# Patient Record
Sex: Male | Born: 1960 | Race: Black or African American | Hispanic: No | Marital: Married | Smoking: Never smoker
Health system: Southern US, Community
[De-identification: ages and names within clinical notes are randomized; demographics above are authoritative.]

---

## 2008-09-04 ENCOUNTER — Emergency Department (HOSPITAL_COMMUNITY): Admission: EM | Admit: 2008-09-04 | Discharge: 2008-09-04 | Payer: Self-pay | Admitting: Emergency Medicine

## 2008-09-16 ENCOUNTER — Encounter: Admission: RE | Admit: 2008-09-16 | Discharge: 2008-09-16 | Payer: Self-pay | Admitting: Chiropractic Medicine
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
## 2019-01-03 DIAGNOSIS — Z8616 Personal history of COVID-19: Secondary | ICD-10-CM

## 2019-01-03 HISTORY — DX: Personal history of COVID-19: Z86.16

## 2019-09-23 ENCOUNTER — Other Ambulatory Visit: Payer: Self-pay

## 2019-09-23 DIAGNOSIS — Z20822 Contact with and (suspected) exposure to covid-19: Secondary | ICD-10-CM

## 2019-09-25 LAB — NOVEL CORONAVIRUS, NAA: SARS-CoV-2, NAA: DETECTED — AB

## 2019-09-25 LAB — SARS-COV-2, NAA 2 DAY TAT

## 2019-09-29 ENCOUNTER — Emergency Department (HOSPITAL_COMMUNITY)
Admission: EM | Admit: 2019-09-29 | Discharge: 2019-09-29 | Disposition: A | Discharge status: Home / Self Care | Payer: HRSA Program | Attending: Emergency Medicine | Admitting: Emergency Medicine

## 2019-09-29 ENCOUNTER — Other Ambulatory Visit: Payer: Self-pay

## 2019-09-29 DIAGNOSIS — U071 COVID-19: Secondary | ICD-10-CM | POA: Diagnosis not present

## 2019-09-29 DIAGNOSIS — R05 Cough: Secondary | ICD-10-CM | POA: Diagnosis present

## 2019-09-29 MED ORDER — SODIUM CHLORIDE 0.9 % IV SOLN
INTRAVENOUS | Status: DC | PRN
  Administered 2019-09-29: 500 mL via INTRAVENOUS

## 2019-09-29 MED ORDER — FAMOTIDINE IN NACL 20-0.9 MG/50ML-% IV SOLN: 20.0000 mg | Freq: Once | INTRAVENOUS | Status: DC | PRN

## 2019-09-29 MED ORDER — ALBUTEROL SULFATE HFA 108 (90 BASE) MCG/ACT IN AERS: 2.0000 | INHALATION_SPRAY | Freq: Once | RESPIRATORY_TRACT | Status: DC | PRN

## 2019-09-29 MED ORDER — EPINEPHRINE 0.3 MG/0.3ML IJ SOAJ: 0.3000 mg | Freq: Once | INTRAMUSCULAR | Status: DC | PRN

## 2019-09-29 MED ORDER — DIPHENHYDRAMINE HCL 50 MG/ML IJ SOLN: 50.0000 mg | Freq: Once | INTRAMUSCULAR | Status: DC | PRN

## 2019-09-29 MED ORDER — METHYLPREDNISOLONE SODIUM SUCC 125 MG IJ SOLR: 125.0000 mg | Freq: Once | INTRAMUSCULAR | Status: DC | PRN

## 2019-09-29 MED ORDER — ACETAMINOPHEN 500 MG PO TABS
1000.0000 mg | ORAL_TABLET | Freq: Once | ORAL | Status: AC
  Administered 2019-09-29: 1000 mg via ORAL
  Filled 2019-09-29: qty 2

## 2019-09-29 MED ORDER — SODIUM CHLORIDE 0.9 % IV SOLN: 1200.0000 mg | Freq: Once | INTRAVENOUS | Status: DC

## 2019-09-29 MED ORDER — SODIUM CHLORIDE 0.9 % IV SOLN
Freq: Once | INTRAVENOUS | Status: AC
  Filled 2019-09-29: qty 20

## 2019-09-29 NOTE — ED Provider Notes (Addendum)
[facility]-EMERGENCY DEPT Provider Note   CSN: [id number] Arrival date & time: 09/29/19  1021     History No chief complaint on file.   Michael Hanna is a 59 y.o. male.  59 year old male was diagnosed with Covid a week ago presents with cough and congestion. Has weakness as well as watery diarrhea. Slight dyspnea on exertion. His temperature at home is better controlled with using ibuprofen. He has not been vaccinated. Has had intermittent headache without photophobia. No meningismus. Is concerned that his symptoms are not improving.        No past medical history on file.  There are no problems to display for this patient.        No family history on file.  Social History   Tobacco Use  . Smoking status: Not on file  Substance Use Topics  . Alcohol use: Not on file  . Drug use: Not on file    Home Medications Prior to Admission medications   Not on File    Allergies    Patient has no allergy information on record.  Review of Systems   Review of Systems  All other systems reviewed and are negative.   Physical Exam Updated Vital Signs BP (!) 131/94 (BP Location: Left Arm)   Pulse 94   Temp (!) 100.5 F (38.1 C) (Oral)   Resp 16   Ht 1.829 m (6')   Wt 86.2 kg   SpO2 96%   BMI 25.77 kg/m   Physical Exam Vitals and nursing note reviewed.  Constitutional:      General: He is not in acute distress.    Appearance: Normal appearance. He is well-developed. He is not toxic-appearing.  HENT:     Head: Normocephalic and atraumatic.  Eyes:     General: Lids are normal.     Conjunctiva/sclera: Conjunctivae normal.     Pupils: Pupils are equal, round, and reactive to light.  Neck:     Thyroid: No thyroid mass.     Trachea: No tracheal deviation.  Cardiovascular:     Rate and Rhythm: Normal rate and regular rhythm.     Heart sounds: Normal heart sounds. No murmur heard.  No gallop.   Pulmonary:     Effort: Pulmonary effort is  normal. No respiratory distress.     Breath sounds: Normal breath sounds. No stridor. No decreased breath sounds, wheezing, rhonchi or rales.  Abdominal:     General: Bowel sounds are normal. There is no distension.     Palpations: Abdomen is soft.     Tenderness: There is no abdominal tenderness. There is no rebound.  Musculoskeletal:        General: No tenderness. Normal range of motion.     Cervical back: Normal range of motion and neck supple.  Skin:    General: Skin is warm and dry.     Findings: No abrasion or rash.  Neurological:     Mental Status: He is alert and oriented to person, place, and time.     GCS: GCS eye subscore is 4. GCS verbal subscore is 5. GCS motor subscore is 6.     Cranial Nerves: No cranial nerve deficit.     Sensory: No sensory deficit.  Psychiatric:        Speech: Speech normal.        Behavior: Behavior normal.     ED Results / Procedures / Treatments   Labs (all labs ordered are listed, but only abnormal  results are displayed) Labs Reviewed - No data to display  EKG None  Radiology No results found.  Procedures Procedures (including critical care time)  Medications Ordered in ED Medications  acetaminophen (TYLENOL) tablet 1,000 mg (has no administration in time range)  casirivimab-imdevimab (REGEN-COV) 1,200 mg in sodium chloride 0.9 % 110 mL IVPB (has no administration in time range)  0.9 %  sodium chloride infusion (has no administration in time range)  diphenhydrAMINE (BENADRYL) injection 50 mg (has no administration in time range)  famotidine (PEPCID) IVPB 20 mg premix (has no administration in time range)  methylPREDNISolone sodium succinate (SOLU-MEDROL) 125 mg/2 mL injection 125 mg (has no administration in time range)  albuterol (VENTOLIN HFA) 108 (90 Base) MCG/ACT inhaler 2 puff (has no administration in time range)  EPINEPHrine (EPI-PEN) injection 0.3 mg (has no administration in time range)    ED Course  I have reviewed the  triage vital signs and the nursing notes.  Pertinent labs & imaging results that were available during my care of the patient were reviewed by me and considered in my medical decision making (see chart for details).    MDM Rules/Calculators/A&P                          Patient given Tylenol for his headache. Low-grade temperature noted. He has no signs of respiratory distress. Will give dose of monoclonal antibody.  Michael Hanna was evaluated in Emergency Department on 09/29/2019 for the symptoms described in the history of present illness. He was evaluated in the context of the global COVID-19 pandemic, which necessitated consideration that the patient might be at risk for infection with the SARS-CoV-2 virus that causes COVID-19. Institutional protocols and algorithms that pertain to the evaluation of patients at risk for COVID-19 are in a state of rapid change based on information released by regulatory bodies including the CDC and federal and state organizations. These policies and algorithms were followed during the patient's care in the ED.  Final Clinical Impression(s) / ED Diagnoses Final diagnoses:  None    Rx / DC Orders ED Discharge Orders    None       Lorre Nick, MD 09/29/19 1115    Lorre Nick, MD 09/29/19 1431

## 2019-09-29 NOTE — ED Notes (Signed)
Pt provided with D/C papers, pt reviewed education on COVID, pt denies questions at this time.

## 2019-09-29 NOTE — ED Triage Notes (Signed)
P arrived via walk in, states he tested COVID (+) x1 wk ago, has had progressively worse headache and fatigue since.

## 2019-10-08 ENCOUNTER — Other Ambulatory Visit: Payer: Self-pay

## 2019-10-08 DIAGNOSIS — Z20822 Contact with and (suspected) exposure to covid-19: Secondary | ICD-10-CM

## 2019-10-09 ENCOUNTER — Telehealth: Payer: Self-pay | Admitting: *Deleted

## 2019-10-09 LAB — NOVEL CORONAVIRUS, NAA: SARS-CoV-2, NAA: NOT DETECTED

## 2019-10-09 LAB — SARS-COV-2, NAA 2 DAY TAT

## 2019-10-09 NOTE — Telephone Encounter (Signed)
Patient's wife inquired about result of COVID test obtained 10/07/19; explained result is not completed; pt informed result are available first in MyChart; pt's wife notified that he will be notified by phone regarding result; she was encouraged to have patient answer all calls; she verbalized understanding.

## 2020-08-05 ENCOUNTER — Ambulatory Visit (INDEPENDENT_AMBULATORY_CARE_PROVIDER_SITE_OTHER): Payer: 59 | Admitting: Orthopedic Surgery

## 2020-08-05 ENCOUNTER — Other Ambulatory Visit: Payer: Self-pay

## 2020-08-05 ENCOUNTER — Encounter: Payer: Self-pay | Admitting: Orthopedic Surgery

## 2020-08-05 VITALS — BP 144/96 | HR 66 | Temp 98.7°F | Ht 72.0 in | Wt 198.0 lb

## 2020-08-05 DIAGNOSIS — R413 Other amnesia: Secondary | ICD-10-CM | POA: Diagnosis not present

## 2020-08-05 DIAGNOSIS — R030 Elevated blood-pressure reading, without diagnosis of hypertension: Secondary | ICD-10-CM | POA: Diagnosis not present

## 2020-08-05 DIAGNOSIS — Z23 Encounter for immunization: Secondary | ICD-10-CM

## 2020-08-05 DIAGNOSIS — R5383 Other fatigue: Secondary | ICD-10-CM | POA: Diagnosis not present

## 2020-08-05 NOTE — Progress Notes (Signed)
Careteam: Patient Care Team: Octavia Heir, NP as PCP - General (Adult Health Nurse Practitioner)  Seen by: Hazle Nordmann, AGNP-C  PLACE OF SERVICE:  [facility]  Advanced Directive information    No Known Allergies  Chief Complaint  Patient presents with   New Patient (Initial Visit)    Patient presents today for a new patient appointment.    Quality Metric Gaps    Per patients care gap he is due for HIV and Hep  C screening, colonoscopy and shringix vaccine and tetanus/tdap vaccine.     HPI: Patient is a 60 y.o. male seen today to establish as a new patient at [facility].   Wife present during encounter.   No previous PCP, would go to urgent care when ill. Married. 4 teenage children in house. Highest level of education, college. 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 Describes job as very stressful.   Covid 16- diagnosed September 2021. He believes he has some long term effects of memory loss and fatigue. He is having to repeat more tasks at work because he forgets if he did them or not. Does not know if fatigue is caused by work environment. Work is very hot in the summer months. He is not vaccinated against covid-19 and does not plan to have them.    Blood pressure- he reports some elevated readings in the past. He will check his blood pressure randomly at drug stores. Unsure if family members have high blood pressure. Admits to following low sodium diet. He is willing to check his blood pressure for two weeks and bring reading next visit. Denies chest pain, sob, headaches or dizziness.   Past history of seasonal allergies, but believes they resolved a few years ago.   Sickel cell trait- he has been told he has trait. Father had sickel cell anemia.   Does not take medication. Will only take tylenol cold and flu or dimetap when ill.   Family History:  Mother- deceased/ 94 and cause unknown  Father- deceased/ age and cause unknown  Reports  siblings having diabetes and kidney disease  Surgeries: Tonsils removed as a child, around age 63.   Hospitalization: none  Procedures: Colonoscopy- never had one. No family history of colon cancer. Denies changes to bowel habits or blood in stool.   Vaccinations: Covid vaccine- does not wish to have Tetanus- does not wish to have  Diet: sandwiches for lunch, eats vegetables daily, does not drink soda, does not eat many sweets often.   Exercise: lots of walking due to job, does not have standard exercise routine  Alcohol: does not drink Smoking: never smoked Illicit drug use: does not do drugs  Eye exam: last exam 07/2019 at [facility], wears glasses, denies changes to vision, plans to schedule this year  Dental exam: no recent dental exam , denies broken teeth or mouth sores.   No advanced directives.              Review of Systems:  Review of Systems  Constitutional:  Positive for malaise/fatigue. Negative for chills and fever.       Nigh sweats  HENT: Negative.    Eyes: Negative.        Wears glasses  Respiratory:  Negative for cough and shortness of breath.   Cardiovascular:  Negative for chest pain, palpitations and leg swelling.  Gastrointestinal: Negative.   Genitourinary: Negative.   Musculoskeletal: Negative.   Skin: Negative.   Neurological:  Negative.   Psychiatric/Behavioral:  Positive for memory loss. Negative for depression. The patient is not nervous/anxious and does not have insomnia.    Past Medical History:  Diagnosis Date   History of COVID-19 2021   History reviewed. No pertinent surgical history. Social History:   reports that he has never smoked. He has never used smokeless tobacco. He reports that he does not drink alcohol and does not use drugs.  Family History  Problem Relation Age of Onset   Diabetes Sister    Diabetes Sister    Heart attack Sister        64's   Diabetes Sister     Medications: Patient's Medications   New Prescriptions   No medications on file  Previous Medications   ACETAMINOPHEN (TYLENOL) 325 MG TABLET    Take 650 mg by mouth every 6 (six) hours as needed for moderate pain.   IBUPROFEN (ADVIL) 200 MG TABLET    Take 200 mg by mouth every 6 (six) hours as needed for fever or moderate pain.  Modified Medications   No medications on file  Discontinued Medications   No medications on file    Physical Exam:  Vitals:   08/05/20 1331  BP: (!) 144/96  Pulse: 66  Temp: 98.7 F (37.1 C)  TempSrc: Temporal  SpO2: 98%  Weight: 198 lb (89.8 kg)  Height: 6' (1.829 m)   Body mass index is 26.85 kg/m. Wt Readings from Last 3 Encounters:  08/05/20 198 lb (89.8 kg)  09/29/19 190 lb (86.2 kg)    Physical Exam Vitals reviewed.  Constitutional:      General: He is not in acute distress. HENT:     Head: Normocephalic.  Cardiovascular:     Rate and Rhythm: Normal rate and regular rhythm.     Pulses: Normal pulses.     Heart sounds: Normal heart sounds. No murmur heard. Pulmonary:     Effort: Pulmonary effort is normal. No respiratory distress.     Breath sounds: Normal breath sounds. No wheezing.  Abdominal:     General: Abdomen is flat. There is no distension.     Palpations: Abdomen is soft.     Tenderness: There is no abdominal tenderness.  Musculoskeletal:     Cervical back: Normal range of motion.     Right lower leg: No edema.     Left lower leg: No edema.  Lymphadenopathy:     Cervical: No cervical adenopathy.  Skin:    General: Skin is warm and dry.     Capillary Refill: Capillary refill takes less than 2 seconds.  Neurological:     General: No focal deficit present.     Mental Status: He is alert and oriented to person, place, and time.  Psychiatric:        Mood and Affect: Mood normal.        Behavior: Behavior normal.    Labs reviewed: Basic Metabolic Panel: No results for input(s): NA, K, CL, CO2, GLUCOSE, BUN, CREATININE, CALCIUM, MG, PHOS, TSH in the  last 8760 hours. Liver Function Tests: No results for input(s): AST, ALT, ALKPHOS, BILITOT, PROT, ALBUMIN in the last 8760 hours. No results for input(s): LIPASE, AMYLASE in the last 8760 hours. No results for input(s): AMMONIA in the last 8760 hours. CBC: No results for input(s): WBC, NEUTROABS, HGB, HCT, MCV, PLT in the last 8760 hours. Lipid Panel: No results for input(s): CHOL, HDL, LDLCALC, TRIG, CHOLHDL, LDLDIRECT in the last 8760 hours. TSH: No  results for input(s): TSH in the last 8760 hours. A1C: No results found for: HGBA1C   Assessment/Plan 1. Elevated blood pressure reading - elevated today, goal < 130/80 - recommend home blood pressure twice daily x 2 weeks- bring next visit - CMP- BUN/creat 20/1.05 08/05/2020  2. Memory loss - reports repeating tasks at work, forgetfulness - MMSE- future  3. Fatigue, unspecified type - reports feeling more tired after having covid 09/2019 - cbc/diff- inconclusive due to sample - TSH- 0.88 08/05/2020 - cbc/diff- future  4. Need for Tdap vaccination - education provided - advised to get at local pharmacy or may get next office visit  MMSE, EKG, lipid panel, cbc/diff, discuss colonoscopy- next visit/ annual physical  Total time: 40 minutes. Greater than 50% of total time spent doing patient education on health maintenance and health promotion.    Next appt: 09/09/2020  Hazle Nordmann, Juel Burrow  [facility] & Adult Medicine [phone]

## 2020-08-06 ENCOUNTER — Other Ambulatory Visit: Payer: Self-pay | Admitting: Orthopedic Surgery

## 2020-08-06 DIAGNOSIS — R5383 Other fatigue: Secondary | ICD-10-CM | POA: Insufficient documentation

## 2020-08-06 DIAGNOSIS — Z0000 Encounter for general adult medical examination without abnormal findings: Secondary | ICD-10-CM

## 2020-08-06 DIAGNOSIS — R030 Elevated blood-pressure reading, without diagnosis of hypertension: Secondary | ICD-10-CM | POA: Insufficient documentation

## 2020-08-06 LAB — COMPREHENSIVE METABOLIC PANEL
AG Ratio: 1.3 (calc) (ref 1.0–2.5)
ALT: 19 U/L (ref 9–46)
AST: 18 U/L (ref 10–35)
Albumin: 4.3 g/dL (ref 3.6–5.1)
Alkaline phosphatase (APISO): 61 U/L (ref 35–144)
BUN: 20 mg/dL (ref 7–25)
CO2: 27 mmol/L (ref 20–32)
Calcium: 9.7 mg/dL (ref 8.6–10.3)
Chloride: 107 mmol/L (ref 98–110)
Creat: 1.05 mg/dL (ref 0.70–1.35)
Globulin: 3.2 g/dL (calc) (ref 1.9–3.7)
Glucose, Bld: 82 mg/dL (ref 65–139)
Potassium: 4.2 mmol/L (ref 3.5–5.3)
Sodium: 142 mmol/L (ref 135–146)
Total Bilirubin: 0.3 mg/dL (ref 0.2–1.2)
Total Protein: 7.5 g/dL (ref 6.1–8.1)

## 2020-08-06 LAB — TSH: TSH: 0.88 mIU/L (ref 0.40–4.50)

## 2020-08-06 LAB — CBC WITH DIFFERENTIAL/PLATELET

## 2020-09-02 ENCOUNTER — Other Ambulatory Visit: Payer: Self-pay

## 2020-09-02 ENCOUNTER — Ambulatory Visit (INDEPENDENT_AMBULATORY_CARE_PROVIDER_SITE_OTHER): Payer: 59 | Admitting: Orthopedic Surgery

## 2020-09-02 ENCOUNTER — Encounter: Payer: Self-pay | Admitting: Orthopedic Surgery

## 2020-09-02 VITALS — BP 160/88 | HR 70 | Temp 98.2°F | Resp 16 | Ht 72.0 in | Wt 197.0 lb

## 2020-09-02 DIAGNOSIS — E7800 Pure hypercholesterolemia, unspecified: Secondary | ICD-10-CM

## 2020-09-02 DIAGNOSIS — Z9189 Other specified personal risk factors, not elsewhere classified: Secondary | ICD-10-CM

## 2020-09-02 DIAGNOSIS — Z1159 Encounter for screening for other viral diseases: Secondary | ICD-10-CM | POA: Diagnosis not present

## 2020-09-02 DIAGNOSIS — R413 Other amnesia: Secondary | ICD-10-CM | POA: Diagnosis not present

## 2020-09-02 DIAGNOSIS — I10 Essential (primary) hypertension: Secondary | ICD-10-CM | POA: Diagnosis not present

## 2020-09-02 MED ORDER — HYDROCHLOROTHIAZIDE 25 MG PO TABS: 12.5000 mg | ORAL_TABLET | Freq: Every day | ORAL | 3 refills | Status: AC

## 2020-09-02 NOTE — Patient Instructions (Signed)
Try meditation or deep breathing to help eliminate stress.   Please take blood pressure 2 hours after taking blood pressure medication and in evening.

## 2020-09-02 NOTE — Progress Notes (Signed)
Careteam: Patient Care Team: Octavia Heir, NP as PCP - General (Adult Health Nurse Practitioner)  Seen by: Hazle Nordmann, AGNP-C  PLACE OF SERVICE:  [facility]  Advanced Directive information    No Known Allergies  No chief complaint on file.    HPI: Patient is a 60 y.o. male seen today for medical management of chronic conditions.   MMSE completed today, score 30/30. Reports memory issues have improved. He does not feel as forgetful.   He has been checking his blood pressure occasionally. He can only recall two readings within the last few days: 140/96, 150/90. He would like to see if his BP is calibrated today.   He reports elevated stress related to his work and busy home life. Denies anxiety. He does not have a outlet to help reduce his stress. Discussed meditation and deep breathing.   He is fasting for blood work today.     Review of Systems:  Review of Systems  Constitutional:  Negative for chills, fever, malaise/fatigue and weight loss.  Respiratory:  Negative for cough, shortness of breath and wheezing.   Cardiovascular:  Negative for chest pain, palpitations and leg swelling.  Gastrointestinal: Negative.   Genitourinary: Negative.   Musculoskeletal: Negative.   Neurological:  Negative for dizziness and headaches.  Psychiatric/Behavioral:  Negative for depression and memory loss. The patient is not nervous/anxious and does not have insomnia.    Past Medical History:  Diagnosis Date   History of COVID-19 2021   No past surgical history on file. Social History:   reports that he has never smoked. He has never used smokeless tobacco. He reports that he does not drink alcohol and does not use drugs.  Family History  Problem Relation Age of Onset   Diabetes Sister    Diabetes Sister    Heart attack Sister        69's   Diabetes Sister     Medications: Patient's Medications  New Prescriptions   No medications on file  Previous Medications    ACETAMINOPHEN (TYLENOL) 325 MG TABLET    Take 650 mg by mouth every 6 (six) hours as needed for moderate pain.   IBUPROFEN (ADVIL) 200 MG TABLET    Take 200 mg by mouth every 6 (six) hours as needed for fever or moderate pain.  Modified Medications   No medications on file  Discontinued Medications   No medications on file    Physical Exam:  There were no vitals filed for this visit. There is no height or weight on file to calculate BMI. Wt Readings from Last 3 Encounters:  08/05/20 198 lb (89.8 kg)  09/29/19 190 lb (86.2 kg)    Physical Exam Vitals reviewed.  Constitutional:      General: He is not in acute distress. HENT:     Head: Normocephalic.  Eyes:     General:        Right eye: No discharge.        Left eye: No discharge.  Neck:     Vascular: No carotid bruit.  Cardiovascular:     Rate and Rhythm: Normal rate and regular rhythm.     Pulses: Normal pulses.     Heart sounds: Normal heart sounds. No murmur heard. Pulmonary:     Effort: Pulmonary effort is normal. No respiratory distress.     Breath sounds: Normal breath sounds. No wheezing.  Abdominal:     General: Bowel sounds are normal. There is no distension.  Palpations: Abdomen is soft.     Tenderness: There is no abdominal tenderness.  Musculoskeletal:     Cervical back: Normal range of motion.     Right lower leg: No edema.     Left lower leg: No edema.  Lymphadenopathy:     Cervical: No cervical adenopathy.  Skin:    General: Skin is warm and dry.     Capillary Refill: Capillary refill takes less than 2 seconds.  Neurological:     General: No focal deficit present.     Mental Status: He is alert and oriented to person, place, and time.  Psychiatric:        Mood and Affect: Mood normal.        Behavior: Behavior normal.    Labs reviewed: Basic Metabolic Panel: Recent Labs    08/05/20 1431  NA 142  K 4.2  CL 107  CO2 27  GLUCOSE 82  BUN 20  CREATININE 1.05  CALCIUM 9.7  TSH 0.88    Liver Function Tests: Recent Labs    08/05/20 1431  AST 18  ALT 19  BILITOT 0.3  PROT 7.5   No results for input(s): LIPASE, AMYLASE in the last 8760 hours. No results for input(s): AMMONIA in the last 8760 hours. CBC: Recent Labs    08/05/20 1431  WBC CANCELED   Lipid Panel: No results for input(s): CHOL, HDL, LDLCALC, TRIG, CHOLHDL, LDLDIRECT in the last 8760 hours. TSH: Recent Labs    08/05/20 1431  TSH 0.88   A1C: No results found for: HGBA1C   Assessment/Plan 1. Primary hypertension - uncontrolled - asymptomatic - ASCVD risk 21.7% - SBP> 150/90, goal < 130/80 - continue to check bp at home twice daily and record readings - hydrochlorothiazide (HYDRODIURIL) 25 MG tablet; Take 0.5 tablets (12.5 mg total) by mouth daily.  Dispense: 90 tablet; Refill: 3 - cmp- future  2. Pure hypercholesterolemia - LDL 134 09/03/2020 - ASCVD risk 21.7% - start Crestor 10 mg daily  - recommend diet low in fat and fried foods- mediterranean diet discussed - lipid panel- future  3. Memory loss - MMSE 30/30 - he reports being less forgetful  4. Need for hepatitis C screening test - hep C antibody- future  5. At risk for increased stress - related to work and busy home life - discussed meditation and deep breathing   Total time: 31 minutes. Greater than 50% of total time spent doing patient education on blood pressure control, stress reducing interventions and medication management.   Labs/tests: bmp, lipid panel, hep c antibody  Next appt: Visit date not found  Abdirahim Flavell Scherry Ran  [facility] Adult Medicine [phone]

## 2020-09-03 ENCOUNTER — Other Ambulatory Visit: Payer: 59

## 2020-09-03 DIAGNOSIS — Z0000 Encounter for general adult medical examination without abnormal findings: Secondary | ICD-10-CM

## 2020-09-03 DIAGNOSIS — R5383 Other fatigue: Secondary | ICD-10-CM

## 2020-09-04 LAB — CBC WITH DIFFERENTIAL/PLATELET
Absolute Monocytes: 684 cells/uL (ref 200–950)
Basophils Absolute: 23 cells/uL (ref 0–200)
Basophils Relative: 0.3 %
Eosinophils Absolute: 53 cells/uL (ref 15–500)
Eosinophils Relative: 0.7 %
HCT: 43.5 % (ref 38.5–50.0)
Hemoglobin: 13.9 g/dL (ref 13.2–17.1)
Lymphs Abs: 1611 cells/uL (ref 850–3900)
MCH: 27.3 pg (ref 27.0–33.0)
MCHC: 32 g/dL (ref 32.0–36.0)
MCV: 85.3 fL (ref 80.0–100.0)
MPV: 11.5 fL (ref 7.5–12.5)
Monocytes Relative: 9 %
Neutro Abs: 5229 cells/uL (ref 1500–7800)
Neutrophils Relative %: 68.8 %
Platelets: 203 10*3/uL (ref 140–400)
RBC: 5.1 10*6/uL (ref 4.20–5.80)
RDW: 13.3 % (ref 11.0–15.0)
Total Lymphocyte: 21.2 %
WBC: 7.6 10*3/uL (ref 3.8–10.8)

## 2020-09-04 LAB — LIPID PANEL
Cholesterol: 186 mg/dL (ref <200)
HDL: 38 mg/dL — ABNORMAL LOW (ref >=40)
LDL Cholesterol (Calc): 134 mg/dL (calc) — ABNORMAL HIGH
Non-HDL Cholesterol (Calc): 148 mg/dL (calc) — ABNORMAL HIGH (ref <130)
Total CHOL/HDL Ratio: 4.9 (calc) (ref <5.0)
Triglycerides: 51 mg/dL (ref <150)

## 2020-09-06 ENCOUNTER — Other Ambulatory Visit: Payer: Self-pay | Admitting: Orthopedic Surgery

## 2020-09-06 DIAGNOSIS — I10 Essential (primary) hypertension: Secondary | ICD-10-CM | POA: Insufficient documentation

## 2020-09-06 DIAGNOSIS — E7800 Pure hypercholesterolemia, unspecified: Secondary | ICD-10-CM | POA: Insufficient documentation

## 2020-09-06 MED ORDER — ROSUVASTATIN CALCIUM 10 MG PO TABS: 10.0000 mg | ORAL_TABLET | Freq: Every day | ORAL | 3 refills | Status: AC

## 2020-09-09 ENCOUNTER — Ambulatory Visit: Payer: 59 | Admitting: Orthopedic Surgery

## 2020-09-24 ENCOUNTER — Ambulatory Visit (INDEPENDENT_AMBULATORY_CARE_PROVIDER_SITE_OTHER): Payer: 59 | Admitting: Nurse Practitioner

## 2020-09-24 ENCOUNTER — Other Ambulatory Visit: Payer: Self-pay

## 2020-09-24 ENCOUNTER — Encounter: Payer: Self-pay | Admitting: Nurse Practitioner

## 2020-09-24 VITALS — BP 138/88 | HR 74 | Temp 97.7°F | Ht 72.0 in | Wt 196.0 lb

## 2020-09-24 DIAGNOSIS — I10 Essential (primary) hypertension: Secondary | ICD-10-CM

## 2020-09-24 DIAGNOSIS — E7800 Pure hypercholesterolemia, unspecified: Secondary | ICD-10-CM

## 2020-09-24 NOTE — Progress Notes (Signed)
Careteam: Patient Care Team: Octavia Heir, NP as PCP - General (Adult Health Nurse Practitioner)  PLACE OF SERVICE:  [facility]  Advanced Directive information    No Known Allergies  Chief Complaint  Patient presents with   2 Week Follow-up    B/P follow-up     HPI: Patient is a 60 y.o. male for follow up blood pressure.  Started hctz and tolerating well. No side effects.  No chest pains, palpitations, shortness of breath Reports he had some tingling in his hand that has now went away.   Blood pressure 124-127/80-90 Today somewhat higher but still at goal.    No caffeine. Does not do caffeine.   Eats a lot of pasta  Likes to bake and broil.  Has out lived both parents.  Review of Systems:  Review of Systems  Constitutional:  Negative for chills, fever and weight loss.  HENT:  Negative for tinnitus.   Respiratory:  Negative for cough, sputum production and shortness of breath.   Cardiovascular:  Negative for chest pain, palpitations and leg swelling.  Gastrointestinal:  Negative for abdominal pain, constipation, diarrhea and heartburn.  Genitourinary:  Negative for dysuria, frequency and urgency.  Musculoskeletal:  Negative for back pain, falls, joint pain and myalgias.  Skin: Negative.   Neurological:  Negative for dizziness and headaches.  Psychiatric/Behavioral:  Negative for depression and memory loss. The patient does not have insomnia.    Past Medical History:  Diagnosis Date   History of COVID-19 2021   History reviewed. No pertinent surgical history. Social History:   reports that he has never smoked. He has never used smokeless tobacco. He reports that he does not drink alcohol and does not use drugs.  Family History  Problem Relation Age of Onset   Diabetes Sister    Diabetes Sister    Heart attack Sister        64's   Diabetes Sister     Medications: Patient's Medications  New Prescriptions   No medications on file  Previous  Medications   ACETAMINOPHEN (TYLENOL) 325 MG TABLET    Take 650 mg by mouth every 6 (six) hours as needed for moderate pain.   HYDROCHLOROTHIAZIDE (HYDRODIURIL) 25 MG TABLET    Take 0.5 tablets (12.5 mg total) by mouth daily.   IBUPROFEN (ADVIL) 200 MG TABLET    Take 200 mg by mouth every 6 (six) hours as needed for fever or moderate pain.   ROSUVASTATIN (CRESTOR) 10 MG TABLET    Take 1 tablet (10 mg total) by mouth daily.  Modified Medications   No medications on file  Discontinued Medications   No medications on file    Physical Exam:  Vitals:   09/24/20 1508  BP: 138/88  Pulse: 74  Temp: 97.7 F (36.5 C)  TempSrc: Temporal  SpO2: 94%  Weight: 196 lb (88.9 kg)  Height: 6' (1.829 m)   Body mass index is 26.58 kg/m. Wt Readings from Last 3 Encounters:  09/24/20 196 lb (88.9 kg)  09/02/20 197 lb (89.4 kg)  08/05/20 198 lb (89.8 kg)    Physical Exam Constitutional:      General: He is not in acute distress.    Appearance: He is well-developed. He is not diaphoretic.  HENT:     Mouth/Throat:     Pharynx: No oropharyngeal exudate.  Eyes:     Conjunctiva/sclera: Conjunctivae normal.     Pupils: Pupils are equal, round, and reactive to light.  Cardiovascular:  Rate and Rhythm: Normal rate and regular rhythm.     Heart sounds: Normal heart sounds.  Pulmonary:     Effort: Pulmonary effort is normal.     Breath sounds: Normal breath sounds.  Musculoskeletal:        General: No tenderness.     Cervical back: Normal range of motion and neck supple.     Right lower leg: No edema.     Left lower leg: No edema.  Skin:    General: Skin is warm and dry.  Neurological:     Mental Status: He is alert and oriented to person, place, and time.    Labs reviewed: Basic Metabolic Panel: Recent Labs    08/05/20 1431  NA 142  K 4.2  CL 107  CO2 27  GLUCOSE 82  BUN 20  CREATININE 1.05  CALCIUM 9.7  TSH 0.88   Liver Function Tests: Recent Labs    08/05/20 1431   AST 18  ALT 19  BILITOT 0.3  PROT 7.5   No results for input(s): LIPASE, AMYLASE in the last 8760 hours. No results for input(s): AMMONIA in the last 8760 hours. CBC: Recent Labs    08/05/20 1431 09/03/20 0908  WBC CANCELED 7.6  NEUTROABS  --  5,229  HGB  --  13.9  HCT  --  43.5  MCV  --  85.3  PLT  --  203   Lipid Panel: Recent Labs    09/03/20 0908  CHOL 186  HDL 38*  LDLCALC 134*  TRIG 51  CHOLHDL 4.9   TSH: Recent Labs    08/05/20 1431  TSH 0.88   A1C: No results found for: HGBA1C   Assessment/Plan 1. Primary hypertension -blood pressure at goal at this time. Continue hydrochlorothiazide 12.5 mg by mouth daily with low sodium diet. Will follow up cmp prior to next visit  2. Pure hypercholesterolemia Continues on crestor without adverse effect. Continue low fat diet.  -will get lipid prior to next follow up.     Next appt: 3 months with labs prior.  Janene Harvey. Biagio Borg  [facility] & Adult Medicine [phone]

## 2020-09-24 NOTE — Patient Instructions (Addendum)
Continue HCTZ for blood pressure.

## 2020-12-28 ENCOUNTER — Other Ambulatory Visit: Payer: Self-pay

## 2020-12-28 ENCOUNTER — Other Ambulatory Visit: Payer: 59

## 2020-12-28 DIAGNOSIS — I10 Essential (primary) hypertension: Secondary | ICD-10-CM

## 2020-12-28 DIAGNOSIS — E7800 Pure hypercholesterolemia, unspecified: Secondary | ICD-10-CM

## 2020-12-29 LAB — LIPID PANEL
Cholesterol: 140 mg/dL (ref <200)
HDL: 37 mg/dL — ABNORMAL LOW (ref >=40)
LDL Cholesterol (Calc): 88 mg/dL (calc)
Non-HDL Cholesterol (Calc): 103 mg/dL (calc) (ref <130)
Total CHOL/HDL Ratio: 3.8 (calc) (ref <5.0)
Triglycerides: 61 mg/dL (ref <150)

## 2020-12-29 LAB — COMPLETE METABOLIC PANEL WITH GFR
AG Ratio: 1.2 (calc) (ref 1.0–2.5)
ALT: 23 U/L (ref 9–46)
AST: 20 U/L (ref 10–35)
Albumin: 4 g/dL (ref 3.6–5.1)
Alkaline phosphatase (APISO): 52 U/L (ref 35–144)
BUN: 21 mg/dL (ref 7–25)
CO2: 29 mmol/L (ref 20–32)
Calcium: 9.2 mg/dL (ref 8.6–10.3)
Chloride: 106 mmol/L (ref 98–110)
Creat: 0.97 mg/dL (ref 0.70–1.35)
Globulin: 3.4 g/dL (calc) (ref 1.9–3.7)
Glucose, Bld: 97 mg/dL (ref 65–99)
Potassium: 3.8 mmol/L (ref 3.5–5.3)
Sodium: 143 mmol/L (ref 135–146)
Total Bilirubin: 0.5 mg/dL (ref 0.2–1.2)
Total Protein: 7.4 g/dL (ref 6.1–8.1)
eGFR: 89 mL/min/{1.73_m2} (ref >=60)

## 2020-12-30 ENCOUNTER — Encounter: Payer: Self-pay | Admitting: Orthopedic Surgery

## 2020-12-30 ENCOUNTER — Other Ambulatory Visit: Payer: Self-pay

## 2020-12-30 ENCOUNTER — Ambulatory Visit (INDEPENDENT_AMBULATORY_CARE_PROVIDER_SITE_OTHER): Payer: Self-pay | Admitting: Orthopedic Surgery

## 2020-12-30 VITALS — BP 128/86 | HR 64 | Temp 96.7°F | Ht 72.0 in | Wt 197.4 lb

## 2020-12-30 DIAGNOSIS — E7800 Pure hypercholesterolemia, unspecified: Secondary | ICD-10-CM

## 2020-12-30 DIAGNOSIS — R5383 Other fatigue: Secondary | ICD-10-CM

## 2020-12-30 DIAGNOSIS — Z9189 Other specified personal risk factors, not elsewhere classified: Secondary | ICD-10-CM

## 2020-12-30 DIAGNOSIS — I10 Essential (primary) hypertension: Secondary | ICD-10-CM

## 2020-12-30 NOTE — Progress Notes (Signed)
Careteam: Patient Care Team: Michael Heir, NP as PCP - General (Adult Health Nurse Practitioner)  Seen by: Hazle Nordmann, AGNP-C  PLACE OF SERVICE:  [facility]  Advanced Directive information    No Known Allergies  Chief Complaint  Patient presents with   Medical Management of Chronic Issues    Patient presents today for a 3 month follow-up.   Quality Metric Gaps    HIV & Hep C screening, zoster, COVID     HPI: Patient is a 60 y.o. male seen today for medical management of chronic conditions.   Labs discussed with patient.   Continues to take HCTZ daily. Checks blood pressures often. Denies chest pain, sob, blurred vision and headaches. Home blood pressures have improved since quitting stressful job. He is now working in medical transportation and happier.   He continues to watch his diet. He would like to go off Crestor and try low fat diet. Discussed risks/benefits. He would like lipid panel rechecked sometime in April.   Does not plan to have any covid vaccinations.   Discussed shingles vaccine today.      Review of Systems:  Review of Systems  Constitutional:  Negative for chills, fever, malaise/fatigue and weight loss.  HENT: Negative.    Eyes: Negative.   Respiratory:  Negative for cough, shortness of breath and wheezing.   Cardiovascular:  Negative for chest pain and leg swelling.  Gastrointestinal: Negative.   Genitourinary: Negative.   Musculoskeletal: Negative.   Neurological:  Negative for dizziness and headaches.  Psychiatric/Behavioral:  Negative for depression. The patient is not nervous/anxious.    Past Medical History:  Diagnosis Date   History of COVID-19 2021   History reviewed. No pertinent surgical history. Social History:   reports that he has never smoked. He has never used smokeless tobacco. He reports that he does not drink alcohol and does not use drugs.  Family History  Problem Relation Age of Onset   Diabetes Sister    Diabetes  Sister    Heart attack Sister        84's   Diabetes Sister     Medications: Patient's Medications  New Prescriptions   No medications on file  Previous Medications   ACETAMINOPHEN (TYLENOL) 325 MG TABLET    Take 650 mg by mouth every 6 (six) hours as needed for moderate pain.   HYDROCHLOROTHIAZIDE (HYDRODIURIL) 25 MG TABLET    Take 0.5 tablets (12.5 mg total) by mouth daily.   IBUPROFEN (ADVIL) 200 MG TABLET    Take 200 mg by mouth every 6 (six) hours as needed for fever or moderate pain.   ROSUVASTATIN (CRESTOR) 10 MG TABLET    Take 1 tablet (10 mg total) by mouth daily.  Modified Medications   No medications on file  Discontinued Medications   No medications on file    Physical Exam:  Vitals:   12/30/20 0923  BP: 128/86  Pulse: 64  Temp: (!) 96.7 F (35.9 C)  SpO2: 98%  Weight: 197 lb 6.4 oz (89.5 kg)  Height: 6' (1.829 m)   Body mass index is 26.77 kg/m. Wt Readings from Last 3 Encounters:  12/30/20 197 lb 6.4 oz (89.5 kg)  09/24/20 196 lb (88.9 kg)  09/02/20 197 lb (89.4 kg)    Physical Exam Vitals reviewed.  Constitutional:      General: He is not in acute distress.    Appearance: He is normal weight.  Eyes:     General:  Right eye: No discharge.        Left eye: No discharge.  Neck:     Vascular: No carotid bruit.  Cardiovascular:     Rate and Rhythm: Normal rate and regular rhythm.     Pulses: Normal pulses.     Heart sounds: Normal heart sounds. No murmur heard. Pulmonary:     Effort: Pulmonary effort is normal. No respiratory distress.     Breath sounds: Normal breath sounds. No wheezing.  Abdominal:     General: Bowel sounds are normal. There is no distension.     Palpations: Abdomen is soft.     Tenderness: There is no abdominal tenderness.  Musculoskeletal:     Right lower leg: No edema.     Left lower leg: No edema.  Skin:    General: Skin is warm and dry.     Capillary Refill: Capillary refill takes less than 2 seconds.   Neurological:     General: No focal deficit present.     Mental Status: He is alert and oriented to person, place, and time.  Psychiatric:        Mood and Affect: Mood normal.        Behavior: Behavior normal.    Labs reviewed: Basic Metabolic Panel: Recent Labs    08/05/20 1431 12/28/20 0825  NA 142 143  K 4.2 3.8  CL 107 106  CO2 27 29  GLUCOSE 82 97  BUN 20 21  CREATININE 1.05 0.97  CALCIUM 9.7 9.2  TSH 0.88  --    Liver Function Tests: Recent Labs    08/05/20 1431 12/28/20 0825  AST 18 20  ALT 19 23  BILITOT 0.3 0.5  PROT 7.5 7.4   No results for input(s): LIPASE, AMYLASE in the last 8760 hours. No results for input(s): AMMONIA in the last 8760 hours. CBC: Recent Labs    08/05/20 1431 09/03/20 0908  WBC CANCELED 7.6  NEUTROABS  --  5,229  HGB  --  13.9  HCT  --  43.5  MCV  --  85.3  PLT  --  203   Lipid Panel: Recent Labs    09/03/20 0908 12/28/20 0825  CHOL 186 140  HDL 38* 37*  LDLCALC 134* 88  TRIG 51 61  CHOLHDL 4.9 3.8   TSH: Recent Labs    08/05/20 1431  TSH 0.88   A1C: No results found for: HGBA1C   Assessment/Plan 1. Primary hypertension - controlled - cont HCTZ - cont to limit sodium in diet  2. Pure hypercholesterolemia - LDL 88 12/28/2020 - would like to wean off medication and try low fat diet - lipid panel- future  3. At increased risk for stress - improved - quit stressful job  4. Fatigue, unspecified type - resolved since quitting stressful job  Total time: 31 minutes. Greater than 50% of total time spent doing patient education regarding blood pressure and cholesterol.    Next appt: Visit date not found  Catarina Huntley Scherry Ran  [facility] [phone]

## 2020-12-30 NOTE — Patient Instructions (Addendum)
Nexium- for heartburn- do a 2 week trial- try to avoid food triggers.   Simethicone tablets for gas.  Shringrix- thinks about getting shingles vaccine.

## 2021-01-04 ENCOUNTER — Other Ambulatory Visit: Payer: 59

## 2021-01-06 ENCOUNTER — Ambulatory Visit: Payer: 59 | Admitting: Orthopedic Surgery

## 2021-05-18 ENCOUNTER — Encounter: Payer: Self-pay | Admitting: Orthopedic Surgery

## 2021-05-19 ENCOUNTER — Encounter: Payer: Self-pay | Admitting: Orthopedic Surgery

## 2021-05-19 ENCOUNTER — Ambulatory Visit (INDEPENDENT_AMBULATORY_CARE_PROVIDER_SITE_OTHER): Payer: Self-pay | Admitting: Orthopedic Surgery

## 2021-05-19 VITALS — HR 79 | Temp 98.4°F | Ht 72.0 in | Wt 179.6 lb

## 2021-05-19 DIAGNOSIS — K921 Melena: Secondary | ICD-10-CM

## 2021-05-19 DIAGNOSIS — F439 Reaction to severe stress, unspecified: Secondary | ICD-10-CM

## 2021-05-19 DIAGNOSIS — R634 Abnormal weight loss: Secondary | ICD-10-CM

## 2021-05-19 DIAGNOSIS — G4709 Other insomnia: Secondary | ICD-10-CM

## 2021-05-19 NOTE — Patient Instructions (Addendum)
Limit screen use prior to bedtime  Meditation/ deep breathing  Sound Machine  Read prior to bedtime  Melatonin- may purchase over the counter- start with 3 mg every night/ max 5-7.5mg   Recommend counseling to help with marital issues   Try reading 5 love languages  [facility] [phone]

## 2021-05-19 NOTE — Progress Notes (Signed)
Careteam: Patient Care Team: Michael Heir, NP as PCP - General (Adult Health Nurse Practitioner)  Seen by: Hazle Nordmann, AGNP-C  PLACE OF SERVICE:  [facility]  Advanced Directive information Does Patient Have a Medical Advance Directive?: No, Would patient like information on creating a medical advance directive?: No - Patient declined  No Known Allergies  Chief Complaint  Patient presents with   Acute Visit    Patient presents today for stress, insomnia, and blood in stool.     HPI: Patient is a 61 y.o. male seen today for acute visit due to stress, insomnia and blood in stool.   One episode of blood in stool. Reports he was constipated. Denies abdominal pain and black tarry stools. Discussed hydration with water.   Weight loss- down 20 lbs in 5 months. Working out more. Eating two meals daily.   Stress related to marital issues. Feels like he is the emotional punching bag. He has not been to counseling. He is also not sleeping. Averaging 2-3 hours of sleep and then wakes up and cannot fall back asleep. Treatment options discussed, does not want prescribed medication at this time.   Review of Systems:  Review of Systems  Constitutional:  Positive for weight loss. Negative for chills, fever and malaise/fatigue.  HENT: Negative.    Eyes: Negative.   Respiratory:  Negative for cough, shortness of breath and wheezing.   Cardiovascular:  Negative for chest pain and leg swelling.  Gastrointestinal:  Positive for blood in stool and constipation. Negative for abdominal pain, diarrhea, heartburn, nausea and vomiting.  Genitourinary: Negative.   Musculoskeletal: Negative.   Skin: Negative.   Neurological: Negative.   Psychiatric/Behavioral:  Negative for depression. The patient has insomnia. The patient is not nervous/anxious.        Increased stress   Past Medical History:  Diagnosis Date   History of COVID-19 2021   History reviewed. No pertinent surgical history. Social  History:   reports that he has never smoked. He has never used smokeless tobacco. He reports that he does not drink alcohol and does not use drugs.  Family History  Problem Relation Age of Onset   Diabetes Sister    Diabetes Sister    Heart attack Sister        35's   Diabetes Sister     Medications: Patient's Medications  New Prescriptions   No medications on file  Previous Medications   ACETAMINOPHEN (TYLENOL) 325 MG TABLET    Take 650 mg by mouth every 6 (six) hours as needed for moderate pain.   HYDROCHLOROTHIAZIDE (HYDRODIURIL) 25 MG TABLET    Take 0.5 tablets (12.5 mg total) by mouth daily.   IBUPROFEN (ADVIL) 200 MG TABLET    Take 200 mg by mouth every 6 (six) hours as needed for fever or moderate pain.   ROSUVASTATIN (CRESTOR) 10 MG TABLET    Take 1 tablet (10 mg total) by mouth daily.  Modified Medications   No medications on file  Discontinued Medications   No medications on file    Physical Exam:  Vitals:   05/19/21 0843  Pulse: 79  Temp: 98.4 F (36.9 C)  TempSrc: Temporal  SpO2: 98%  Weight: 179 lb 9.6 oz (81.5 kg)  Height: 6' (1.829 m)   Body mass index is 24.36 kg/m. Wt Readings from Last 3 Encounters:  05/19/21 179 lb 9.6 oz (81.5 kg)  12/30/20 197 lb 6.4 oz (89.5 kg)  09/24/20 196 lb (88.9 kg)  Physical Exam Vitals reviewed.  Constitutional:      General: He is not in acute distress. HENT:     Head: Normocephalic.  Eyes:     General:        Right eye: No discharge.        Left eye: No discharge.  Cardiovascular:     Rate and Rhythm: Normal rate and regular rhythm.     Pulses: Normal pulses.     Heart sounds: Normal heart sounds.  Abdominal:     General: Bowel sounds are normal. There is no distension.     Palpations: Abdomen is soft.     Tenderness: There is no abdominal tenderness. There is no guarding.  Musculoskeletal:     Cervical back: Neck supple.     Right lower leg: No edema.     Left lower leg: No edema.  Skin:     General: Skin is warm and dry.     Capillary Refill: Capillary refill takes less than 2 seconds.  Neurological:     General: No focal deficit present.     Mental Status: He is alert and oriented to person, place, and time.  Psychiatric:        Behavior: Behavior normal.    Labs reviewed: Basic Metabolic Panel: Recent Labs    08/05/20 1431 12/28/20 0825  NA 142 143  K 4.2 3.8  CL 107 106  CO2 27 29  GLUCOSE 82 97  BUN 20 21  CREATININE 1.05 0.97  CALCIUM 9.7 9.2  TSH 0.88  --    Liver Function Tests: Recent Labs    08/05/20 1431 12/28/20 0825  AST 18 20  ALT 19 23  BILITOT 0.3 0.5  PROT 7.5 7.4   No results for input(s): LIPASE, AMYLASE in the last 8760 hours. No results for input(s): AMMONIA in the last 8760 hours. CBC: Recent Labs    08/05/20 1431 09/03/20 0908  WBC CANCELED 7.6  NEUTROABS  --  5,229  HGB  --  13.9  HCT  --  43.5  MCV  --  85.3  PLT  --  203   Lipid Panel: Recent Labs    09/03/20 0908 12/28/20 0825  CHOL 186 140  HDL 38* 37*  LDLCALC 134* 88  TRIG 51 61  CHOLHDL 4.9 3.8   TSH: Recent Labs    08/05/20 1431  TSH 0.88   A1C: No results found for: HGBA1C   Assessment/Plan 1. Other insomnia - sleeping 2-3 hours then wakes up and cannot fall asleep - suspect related to stress/marital issues - does not want prescribed pain medication - recommend trying melatonin 3 mg po qhs, max dose 5-7.5 mg  - recommend limiting screen time prior to bedtime - try sound machine or reading prior to bedtime  2. Stress - see above - advised meditate - recommend counseling   3. Weight loss - down 18 lbs from last encounter - reports working out more  4. Blood in stool - one episode - suspect due to constipation - exam unremarkable today  Total time: 25 minutes. Greater than 50% of total time spent doing patient education regarding insomnia, stress, constipation and weight loss.    Next appt: none Daliana Leverett Scherry Ran  [facility] [phone]

## 2021-09-18 ENCOUNTER — Encounter (HOSPITAL_COMMUNITY): Payer: Self-pay

## 2021-09-18 ENCOUNTER — Emergency Department (HOSPITAL_COMMUNITY): Payer: No Typology Code available for payment source

## 2021-09-18 ENCOUNTER — Other Ambulatory Visit: Payer: Self-pay

## 2021-09-18 ENCOUNTER — Emergency Department (HOSPITAL_COMMUNITY)
Admission: EM | Admit: 2021-09-18 | Discharge: 2021-09-18 | Disposition: A | Discharge status: Home / Self Care | Payer: No Typology Code available for payment source | Attending: Emergency Medicine | Admitting: Emergency Medicine

## 2021-09-18 DIAGNOSIS — Z8616 Personal history of COVID-19: Secondary | ICD-10-CM | POA: Insufficient documentation

## 2021-09-18 DIAGNOSIS — G43409 Hemiplegic migraine, not intractable, without status migrainosus: Secondary | ICD-10-CM | POA: Diagnosis not present

## 2021-09-18 DIAGNOSIS — R519 Headache, unspecified: Secondary | ICD-10-CM | POA: Diagnosis present

## 2021-09-18 MED ORDER — SODIUM CHLORIDE 0.9 % IV BOLUS
1000.0000 mL | Freq: Once | INTRAVENOUS | Status: AC
  Administered 2021-09-18: 1000 mL via INTRAVENOUS

## 2021-09-18 MED ORDER — METOCLOPRAMIDE HCL 5 MG/ML IJ SOLN
10.0000 mg | Freq: Once | INTRAMUSCULAR | Status: AC
  Administered 2021-09-18: 10 mg via INTRAVENOUS
  Filled 2021-09-18: qty 2

## 2021-09-18 MED ORDER — DIPHENHYDRAMINE HCL 50 MG/ML IJ SOLN
25.0000 mg | Freq: Once | INTRAMUSCULAR | Status: AC
  Administered 2021-09-18: 25 mg via INTRAVENOUS
  Filled 2021-09-18: qty 1

## 2021-09-18 MED ORDER — KETOROLAC TROMETHAMINE 15 MG/ML IJ SOLN
15.0000 mg | Freq: Once | INTRAMUSCULAR | Status: AC
  Administered 2021-09-18: 15 mg via INTRAVENOUS
  Filled 2021-09-18: qty 1

## 2021-09-18 NOTE — ED Triage Notes (Addendum)
Ambulatory to ED with c/o headache x 3 days. Hx of migraines as a kid. States he thinks it might be a sinus headache or sinus infection.   States having a lot of marital stress as well.

## 2021-09-18 NOTE — ED Provider Notes (Signed)
[facility] DEPT Provider Note: Georgena Spurling, MD, FACEP  CSN: [id number] MRN: [id number] ARRIVAL: 09/18/21 at [place]: [place]  Headache   HISTORY OF PRESENT ILLNESS  09/18/21 4:21 AM Michael Hanna is a 61 y.o. male with a headache for the past 3 days.  The headache is present on the left side of his head and face.  The pain is constant and he rates it as a 7 out of 10.  There is not a jabbing or electric-like component.  He has had similar headaches in the past, most recently about 3 months ago, but never this severe.  He denies any associated photophobia, visual changes, nausea, vomiting, numbness or weakness.  He does admit to recent marital stress.   Past Medical History:  Diagnosis Date   History of COVID-19 2021    History reviewed. No pertinent surgical history.  Family History  Problem Relation Age of Onset   Diabetes Sister    Diabetes Sister    Heart attack Sister        67's   Diabetes Sister     Social History   Tobacco Use   Smoking status: Never   Smokeless tobacco: Never  Vaping Use   Vaping Use: Never used  Substance Use Topics   Alcohol use: Never   Drug use: Never    Prior to Admission medications   Medication Sig Start Date End Date Taking? Authorizing Provider  hydrochlorothiazide (HYDRODIURIL) 25 MG tablet Take 0.5 tablets (12.5 mg total) by mouth daily. Patient not taking: Reported on 09/18/2021 09/02/20   Yvonna Alanis, NP  rosuvastatin (CRESTOR) 10 MG tablet Take 1 tablet (10 mg total) by mouth daily. Patient not taking: Reported on 09/18/2021 09/06/20   Yvonna Alanis, NP    Allergies Patient has no known allergies.   REVIEW OF SYSTEMS  Negative except as noted here or in the History of Present Illness.   PHYSICAL EXAMINATION  Initial Vital Signs Blood pressure (!) 166/100, pulse 89, temperature 98.2 F (36.8 C), temperature source Oral, resp. rate 17, height 6' (1.829 m), weight 74.8 kg, SpO2 100  %.  Examination General: Well-developed, well-nourished male in no acute distress; appearance consistent with age of record HENT: normocephalic; atraumatic Eyes: pupils equal, round and reactive to light; extraocular muscles intact Neck: supple Heart: regular rate and rhythm Lungs: clear to auscultation bilaterally Abdomen: soft; nondistended; nontender; bowel sounds present Extremities: No deformity; full range of motion Neurologic: Awake, alert and oriented; motor function intact in all extremities and symmetric; no facial droop; normal coordination, speech and gait Skin: Warm and dry Psychiatric: Normal mood and affect   RESULTS  Summary of this visit's results, reviewed and interpreted by myself:   EKG Interpretation  Date/Time:    Ventricular Rate:    PR Interval:    QRS Duration:   QT Interval:    QTC Calculation:   R Axis:     Text Interpretation:         Laboratory Studies: No results found for this or any previous visit (from the past 24 hour(s)). Imaging Studies: CT Head Wo Contrast  Result Date: 09/18/2021 CLINICAL DATA:  61 year old male with persistent headache for 3 days. EXAM: CT HEAD WITHOUT CONTRAST TECHNIQUE: Contiguous axial images were obtained from the base of the skull through the vertex without intravenous contrast. RADIATION DOSE REDUCTION: This exam was performed according to the departmental dose-optimization program which includes automated exposure control, adjustment of the mA  and/or kV according to patient size and/or use of iterative reconstruction technique. COMPARISON:  None Available. FINDINGS: Brain: Cerebral volume is within normal limits for age. Incidental dural calcifications along the falx and tentorium. No midline shift, ventriculomegaly, mass effect, evidence of mass lesion, intracranial hemorrhage or evidence of cortically based acute infarction. Gray-white matter differentiation is within normal limits throughout the brain. Normal  basilar cisterns. Vascular: Faint Calcified atherosclerosis at the skull base. No suspicious intracranial vascular hyperdensity. Skull: Negative. Sinuses/Orbits: Tympanic cavities, visualized paranasal sinuses and mastoids are clear. Other: Visualized orbits and scalp soft tissues are within normal limits. IMPRESSION: Normal for age noncontrast Head CT. Electronically Signed   By: Genevie Ann M.D.   On: 09/18/2021 05:37    ED COURSE and MDM  Nursing notes, initial and subsequent vitals signs, including pulse oximetry, reviewed and interpreted by myself.  Vitals:   09/18/21 0416 09/18/21 0530  BP: (!) 166/100 (!) 137/98  Pulse: 89 68  Resp: 17 17  Temp: 98.2 F (36.8 C)   TempSrc: Oral   SpO2: 100% 98%  Weight: 74.8 kg   Height: 6' (1.829 m)    Medications  sodium chloride 0.9 % bolus 1,000 mL (1,000 mLs Intravenous New Bag/Given 09/18/21 0518)  diphenhydrAMINE (BENADRYL) injection 25 mg (25 mg Intravenous Given 09/18/21 0513)  metoCLOPramide (REGLAN) injection 10 mg (10 mg Intravenous Given 09/18/21 0516)  ketorolac (TORADOL) 15 MG/ML injection 15 mg (15 mg Intravenous Given 09/18/21 0515)   6:02 AM Patient headache significantly improved after IV fluids and medications.  I suspect this represents a migraine as it responded to the usual migraine medications.  His head CT is unremarkable which is reassuring.   PROCEDURES  Procedures   ED DIAGNOSES     ICD-10-CM   1. Sporadic migraine  G43.[place]          Taimur Fier, MD 09/18/21 [phone]

## 2021-10-24 ENCOUNTER — Other Ambulatory Visit: Payer: Self-pay

## 2021-10-24 DIAGNOSIS — I10 Essential (primary) hypertension: Secondary | ICD-10-CM

## 2021-12-29 ENCOUNTER — Ambulatory Visit: Payer: Medicaid Other | Admitting: Orthopedic Surgery
